# Patient Record
Sex: Male | Born: 2000 | Race: White | Hispanic: No | Marital: Single | Smoking: Never smoker
Health system: Southern US, Community
[De-identification: ages and names within clinical notes are randomized; demographics above are authoritative.]

## PROBLEM LIST (undated history)

## (undated) DIAGNOSIS — R519 Headache, unspecified: Secondary | ICD-10-CM

## (undated) DIAGNOSIS — R51 Headache: Secondary | ICD-10-CM

## (undated) HISTORY — DX: Headache, unspecified: R51.9

## (undated) HISTORY — PX: WISDOM TOOTH EXTRACTION: SHX21

## (undated) HISTORY — DX: Headache: R51

---
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
## 2022-02-05 ENCOUNTER — Encounter (INDEPENDENT_AMBULATORY_CARE_PROVIDER_SITE_OTHER): Payer: Self-pay

## 2022-02-22 LAB — T4, FREE: Free T4: 1.1 ng/dL (ref 0.8–1.4)

## 2022-02-22 LAB — T3, FREE: T3, Free: 3.9 pg/mL (ref 3.0–4.7)

## 2022-02-22 LAB — TSH: TSH: 1.44 mIU/L (ref 0.50–4.30)

## 2022-02-25 ENCOUNTER — Encounter (INDEPENDENT_AMBULATORY_CARE_PROVIDER_SITE_OTHER): Payer: Self-pay

## 2022-05-26 ENCOUNTER — Ambulatory Visit (INDEPENDENT_AMBULATORY_CARE_PROVIDER_SITE_OTHER): Payer: Medicaid Other | Admitting: "Endocrinology

## 2022-05-26 ENCOUNTER — Other Ambulatory Visit (INDEPENDENT_AMBULATORY_CARE_PROVIDER_SITE_OTHER): Payer: Self-pay

## 2022-05-26 DIAGNOSIS — E063 Autoimmune thyroiditis: Secondary | ICD-10-CM

## 2022-05-26 DIAGNOSIS — E049 Nontoxic goiter, unspecified: Secondary | ICD-10-CM

## 2022-05-27 ENCOUNTER — Ambulatory Visit (INDEPENDENT_AMBULATORY_CARE_PROVIDER_SITE_OTHER): Payer: Medicaid Other | Admitting: Pediatric Endocrinology

## 2022-05-30 LAB — THYROID STIMULATING IMMUNOGLOBULIN: TSI: <89 % baseline (ref <140)

## 2022-05-30 LAB — TSH: TSH: 0.74 mIU/L (ref 0.50–4.30)

## 2022-05-30 LAB — THYROGLOBULIN ANTIBODY: Thyroglobulin Ab: <1 IU/mL (ref <=1)

## 2022-05-30 LAB — THYROID PEROXIDASE ANTIBODY: Thyroperoxidase Ab SerPl-aCnc: <1 IU/mL (ref <9)

## 2022-05-30 LAB — T3, FREE: T3, Free: 4.4 pg/mL (ref 3.0–4.7)

## 2022-07-10 ENCOUNTER — Telehealth (INDEPENDENT_AMBULATORY_CARE_PROVIDER_SITE_OTHER): Payer: Self-pay

## 2022-07-10 ENCOUNTER — Encounter (INDEPENDENT_AMBULATORY_CARE_PROVIDER_SITE_OTHER): Payer: Self-pay | Admitting: Pediatric Endocrinology

## 2022-07-10 ENCOUNTER — Ambulatory Visit (INDEPENDENT_AMBULATORY_CARE_PROVIDER_SITE_OTHER): Payer: Medicaid Other | Admitting: Pediatric Endocrinology

## 2022-07-10 VITALS — BP 124/80 | HR 82 | Ht 68.31 in | Wt 160.6 lb

## 2022-07-10 DIAGNOSIS — Z808 Family history of malignant neoplasm of other organs or systems: Secondary | ICD-10-CM | POA: Diagnosis not present

## 2022-07-10 DIAGNOSIS — N62 Hypertrophy of breast: Secondary | ICD-10-CM

## 2022-07-10 DIAGNOSIS — E049 Nontoxic goiter, unspecified: Secondary | ICD-10-CM | POA: Diagnosis not present

## 2022-07-10 NOTE — Telephone Encounter (Signed)
Called pt to get preference of location, time and date for ultrasound. Pt had no preference to day or time as long as it is before Jan 15th 2024. Pt also preferred a location of [place]  on [address].  Called [facility], turns out they are a clinic, not a radiology office. They suggested  [place] [address], [place] [address]  [phone]  Called this location and made appt for Jul 11 2022 at [place] calling pt to relay info, had to lvm for them to call me back. Will also send Mychart message with details.

## 2022-07-10 NOTE — Progress Notes (Signed)
Subjective:  Subjective  Patient Name: Brandon Russell Date of Birth: 2001-04-15  MRN: [id number]  Brandon Russell  presents at his clinic visit today for follow up  evaluation and management of his gynecomastia, goiter, and thyroiditis.  HISTORY OF PRESENT ILLNESS:   Brandon Russell is a 22 y.o. Caucasian young man.   Brandon Russell was accompanied by grandmother  Brandon Russell has his initial pediatric endocrine consultation on 04/23/17 with Dr. Fransico Michael. He is followed for concerns regarding gynecomastia (now s/p surgical excision of breast buds) and thyroid goiter.   Thyroid: Mom had follicular thyroid cancer. Maternal grandmother had papillary thyroid cancer and breast cancer. [Addendum 04/20/18: Maternal great grandmother had thyroid issues and took Synthroid. Maternal grand uncle had testicular CA at age 29.    2. [name][facility] visit occurred on 11/21/21 with Dr. Fransico Michael. He returns at this time for routine follow up. He had labs drawn in November with normal thyroid function and negative thyroid antibodies.   He has been noting recently that his thyroid feels "firmish". He thinks that he has been sleeping "awkwardly" and that his neck has been sore. He has not had any issues with swallowing.   Energy level has been good Weight has been stable.  No issues with stool   3. Pertinent Review of Systems:  Constitutional: Brandon Russell feels "tired". He has been healthy and active.  Eyes: Vision seems to be good. There are no recognized eye problems. Neck: He has no complaints of anterior neck swelling, soreness, tenderness, pressure, discomfort, or difficulty swallowing.   Heart: Heart rate increases with exercise or other physical activity. He has no complaints of palpitations, irregular heart beats, chest pain, or chest pressure.   Lungs: No asthma, wheezing, shortness of breathing  Gastrointestinal: Bowel movents seem normal. He has no complaints of acid  reflux, diarrhea, or constipation.  Hands: No tremor.  Legs: Muscle mass and strength seem normal. There are no complaints of numbness, tingling, burning, or pain. No edema is noted.  Feet: There are no obvious foot problems. There are no complaints of numbness, tingling, burning, or pain. No edema is noted. Neurologic: There are no recognized problems with muscle movement and strength, sensation, or coordination. Frequent tension headaches GU: s/p male gynecomastia resection    PAST MEDICAL, FAMILY, AND SOCIAL HISTORY  Past Medical History:  Diagnosis Date   Headache     Family History  Problem Relation Age of Onset   Cancer Mother    Hypertension Father    Migraines Maternal Aunt      Current Outpatient Medications:    ibuprofen (ADVIL) 200 MG tablet, Take 200-400 mg by mouth every 8 (eight) hours as needed (for pain)., Disp: , Rfl:    ketoconazole (NIZORAL) 2 % cream, Apply 1 Application topically 2 (two) times daily., Disp: , Rfl:   Allergies as of 07/10/2022 - Review Complete 07/10/2022  Allergen Reaction Noted   Bee venom Anaphylaxis 07/10/2011     reports that he has never smoked. He has never used smokeless tobacco. He reports that he does not drink alcohol and does not use drugs. Pediatric History  Patient Parents   Not on file   Other Topics Concern   Not on file  Social History Narrative   12th grade at [employer] 21-22 school year. Lives with brother sister, cousin, grandparents (legal guardians)    1. School and Family: He graduated high school in 2022. He is a Medical laboratory scientific officer at [employer]. When he is  not at college he lives with his grandparents. He is majoring in Energy manager  2. Activities: He walks a lot at [employer]. He golfs a lot now.  3. Primary Care Provider: Coletta Memos, PA-C  4. Health insurance: [place] Medicaid  REVIEW OF SYSTEMS: There are no other significant problems involving Xzavian's other body systems.    Objective:   Objective  Vital Signs:   BP 124/80 (BP Location: Right Arm, Patient Position: Sitting, Cuff Size: Large)   Pulse 82   Ht 5' 8.31" (1.735 m)   Wt 160 lb 9.6 oz (72.8 kg)   BMI 24.20 kg/m    Ht Readings from Last 3 Encounters:  07/10/22 5' 8.31" (1.735 m) (33 %, Z= -0.45)*  11/21/21 5' 8.27" (1.734 m) (33 %, Z= -0.43)*  04/22/21 5' 8.07" (1.729 m) (32 %, Z= -0.46)*   * Growth percentiles are based on CDC (Boys, 2-20 Years) data.   Wt Readings from Last 3 Encounters:  07/10/22 160 lb 9.6 oz (72.8 kg) (61 %, Z= 0.27)*  11/21/21 152 lb 6.4 oz (69.1 kg) (52 %, Z= 0.05)*  04/22/21 130 lb (59 kg) (19 %, Z= -0.89)*   * Growth percentiles are based on CDC (Boys, 2-20 Years) data.   HC Readings from Last 3 Encounters:  No data found for [facility]   Body surface area is 1.87 meters squared. 33 %ile (Z= -0.45) based on CDC (Boys, 2-20 Years) Stature-for-age data based on Stature recorded on 07/10/2022. 61 %ile (Z= 0.27) based on CDC (Boys, 2-20 Years) weight-for-age data using vitals from 07/10/2022.  PHYSICAL EXAM: Physical Exam Constitutional:      Appearance: Normal appearance. He is normal weight.  HENT:     Head: Normocephalic.     Right Ear: External ear normal.     Left Ear: External ear normal.     Mouth/Throat:     Mouth: Mucous membranes are moist.  Eyes:     Extraocular Movements: Extraocular movements intact.     Conjunctiva/sclera: Conjunctivae normal.  Neck:     Thyroid: Thyromegaly present. No thyroid tenderness.     Comments: Thyroid is somewhat firm in texture Cardiovascular:     Rate and Rhythm: Normal rate and regular rhythm.     Pulses: Normal pulses.     Heart sounds: Normal heart sounds.  Pulmonary:     Effort: Pulmonary effort is normal.     Breath sounds: Normal breath sounds.  Abdominal:     General: Bowel sounds are normal.     Palpations: Abdomen is soft.  Musculoskeletal:        General: Normal range of motion.     Cervical back: Normal range of  motion.  Lymphadenopathy:     Cervical: No cervical adenopathy.  Skin:    General: Skin is warm and dry.     Capillary Refill: Capillary refill takes less than 2 seconds.  Neurological:     General: No focal deficit present.     Mental Status: He is alert.  Psychiatric:        Mood and Affect: Mood normal.      LAB DATA:   Lab Results  Component Value Date   TSH 0.74 05/26/2022   TSH 1.44 02/21/2022   TSH 1.09 04/22/2021   TSH 0.69 09/26/2020   TSH 0.91 10/26/2019   TSH 1.01 05/19/2019   Lab Results  Component Value Date   FREET4 1.1 02/21/2022   FREET4 1.3 04/22/2021   FREET4 1.4 09/26/2020  FREET4 1.3 10/26/2019   FREET4 1.1 05/19/2019   FREET4 1.2 04/14/2018   Lab Results  Component Value Date   T3FREE 4.4 05/26/2022   T3FREE 3.9 02/21/2022   T3FREE 3.8 04/22/2021   T3FREE 3.9 09/26/2020   T3FREE 4.2 10/26/2019   T3FREE 3.7 05/19/2019   Thyroid antibodies negative.   IMAGING:   Thyroid US 05/01/17: Right lobe measured 4.9 cm in largest dimension. Left lobe measure 3.9 cm. No nodules were seen.     Assessment and Plan:  Assessment  ASSESSMENT: Courtney is a 22 y.o. Caucasian male who was followed by Dr. Tobe Sos for gynecomastia and thyroid goiter (with family history of thyroid cancer)   1. Gynecomastia:   Now s/p surgical repair  He is pleased with current chest appearance  2. Thyroid goiter  Thyroid gland is firm on exam today  Last thyroid ultrasound was 5 years ago  Strong family history of thyroid cancer  TSH, free T4 and T3 all normal  Thyroid antibodies are negative  PLAN:  1. Diagnostic:  Orders Placed This Encounter  Procedures   US THYROID    Order Specific Question:   Reason for Exam (SYMPTOM  OR DIAGNOSIS REQUIRED)    Answer:   goiter with family history of thyroid carcinoma    Order Specific Question:   Preferred imaging location?    Answer:   [facility]    Order Specific Question:   Call Results- Best Contact  Number?    Answer:   [phone]    2. Therapeutic: Continue exercise.  3. Patient education: We discussed all of the above at great length.  4. Follow-up: Return for parental or physican concerns.    Level of Service: >40 minutes spent today reviewing the medical chart, counseling the patient/family, and documenting today's encounter.   Lelon Huh, MD

## 2022-07-11 ENCOUNTER — Ambulatory Visit (HOSPITAL_BASED_OUTPATIENT_CLINIC_OR_DEPARTMENT_OTHER): Admission: RE | Admit: 2022-07-11 | Payer: Medicaid Other | Source: Ambulatory Visit

## 2022-08-07 ENCOUNTER — Encounter (INDEPENDENT_AMBULATORY_CARE_PROVIDER_SITE_OTHER): Payer: Self-pay

## 2023-01-09 ENCOUNTER — Encounter (INDEPENDENT_AMBULATORY_CARE_PROVIDER_SITE_OTHER): Payer: Self-pay
